# Patient Record
Sex: Female | Born: 1995 | Race: Black or African American | Hispanic: No | Marital: Single | Smoking: Never smoker
Health system: Southern US, Community
[De-identification: ages and names within clinical notes are randomized; demographics above are authoritative.]

## PROBLEM LIST (undated history)

## (undated) DIAGNOSIS — N6312 Unspecified lump in the right breast, upper inner quadrant: Secondary | ICD-10-CM

---
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
## 2018-11-07 IMAGING — CT CT HEAD W/O CM
3 series · 14 of 46 positions shown, 16 images · non-contrast
Comparison: None.

CLINICAL DATA: Persistent headache with dilatation of pupils

EXAM:
CT HEAD WITHOUT CONTRAST
TECHNIQUE: Contiguous axial images were obtained from the base of the skull
through the vertex without intravenous contrast.

[Series 2: head wo · axial · 0.47mm/px · z∈[+1578,+1698]mm · 8 of 29 slices shown, 10 images]
[im 3/29  brain]
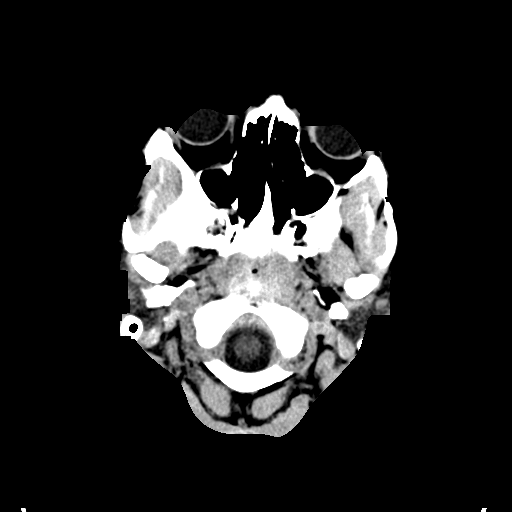
[im 3/29  bone]
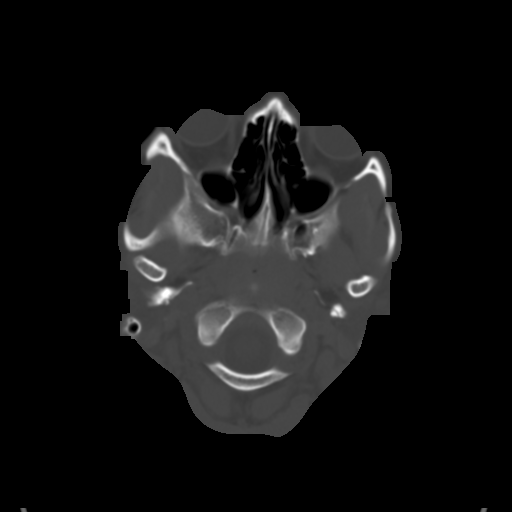
[im 7/29  brain]
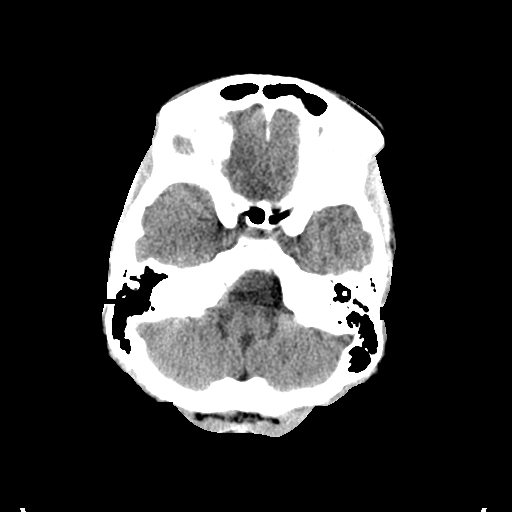
[im 10/29  brain]
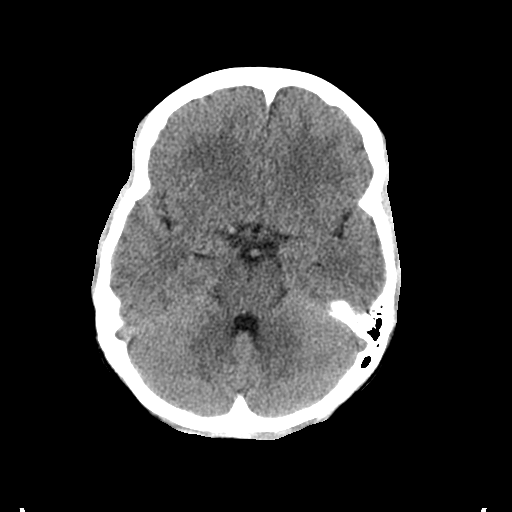
[im 13/29  brain]
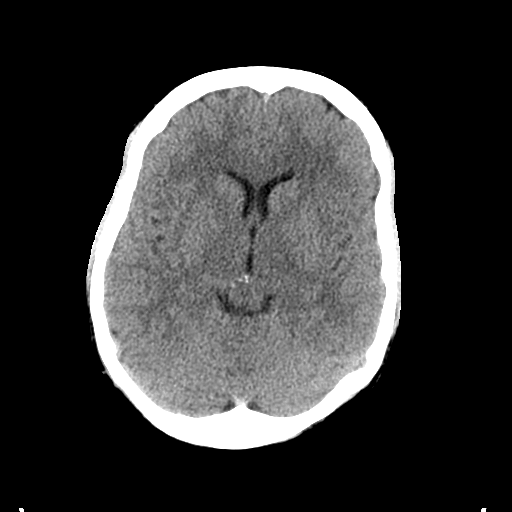
[im 17/29  brain]
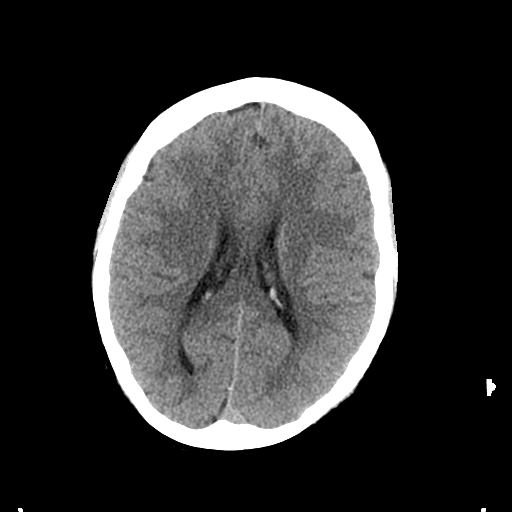
[im 17/29  bone]
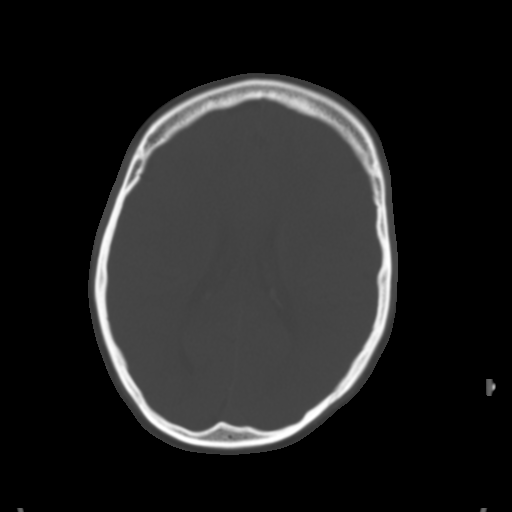
[im 20/29  brain]
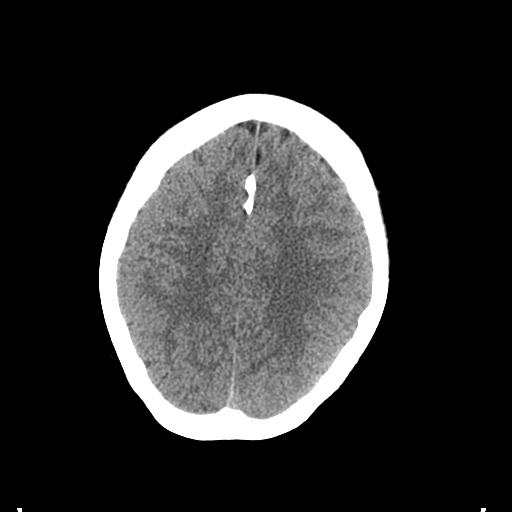
[im 23/29  brain]
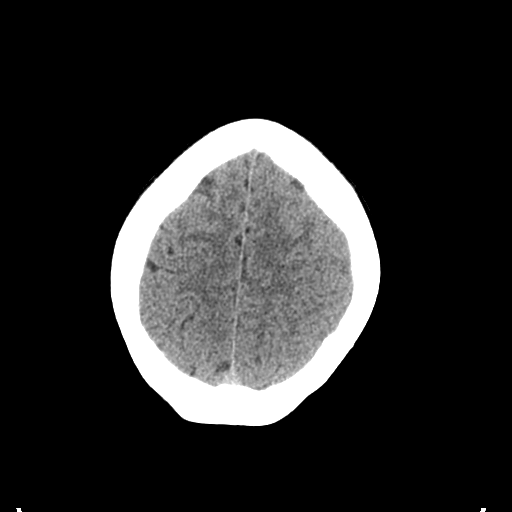
[im 27/29  brain]
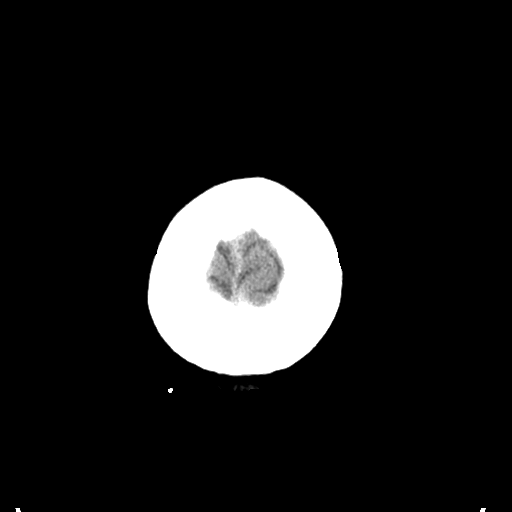

[Series 4: coronal soft tissue · coronal · 0.28mm/px · 3 of 81 slices shown]
[im 27/81  brain]
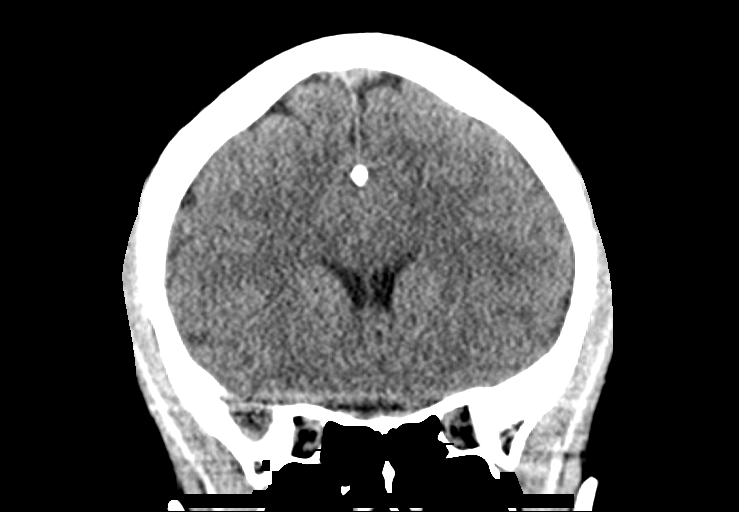
[im 36/81  brain]
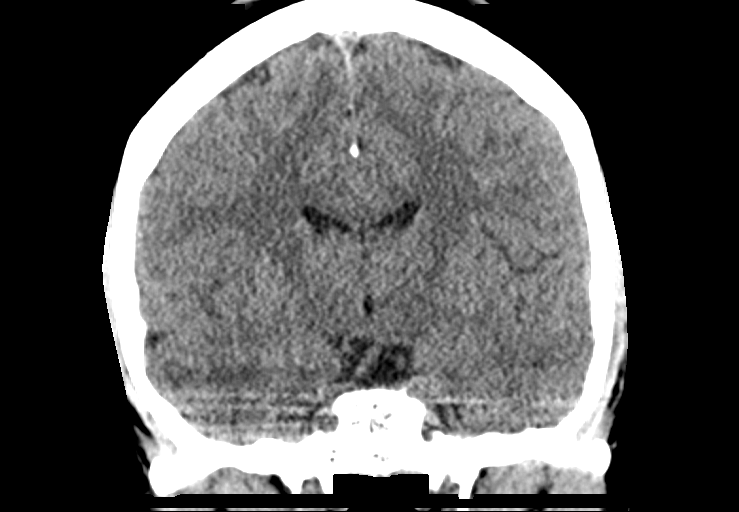
[im 45/81  brain]
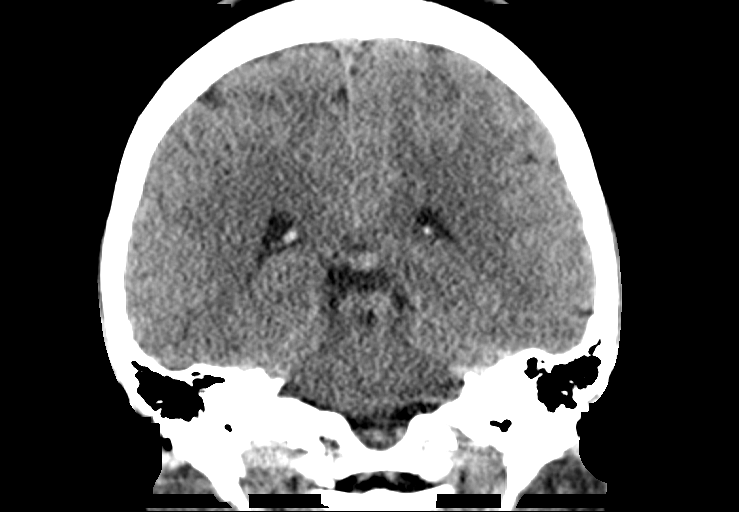

[Series 5: sagittal soft tissue · sagittal · 0.33mm/px · 3 of 61 slices shown]
[im 21/61  brain]
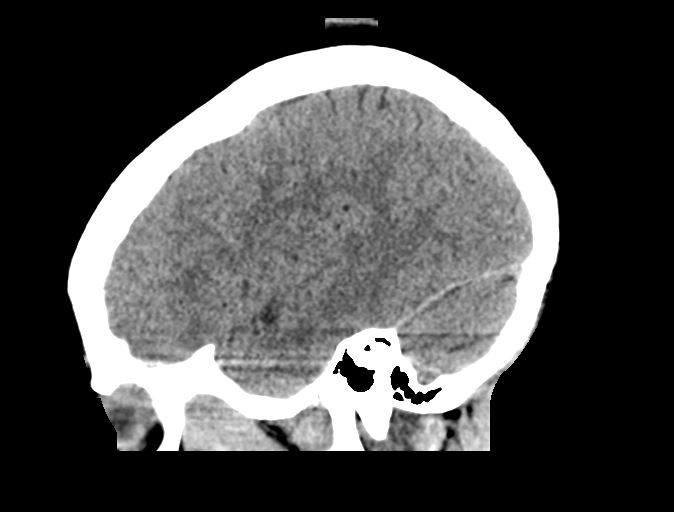
[im 31/61  brain]
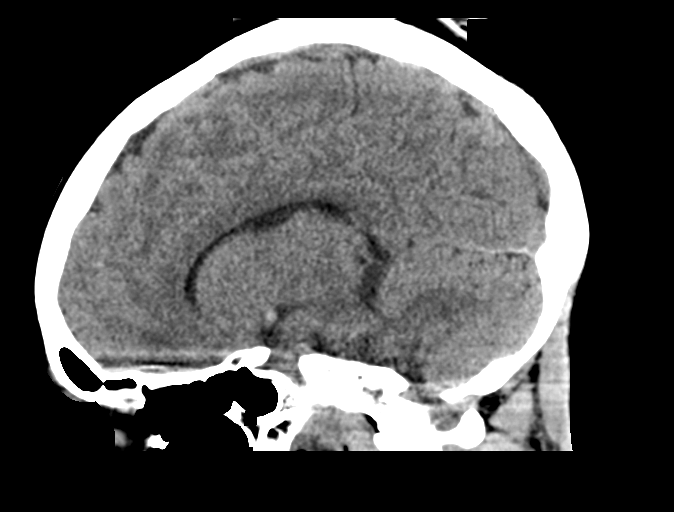
[im 41/61  brain]
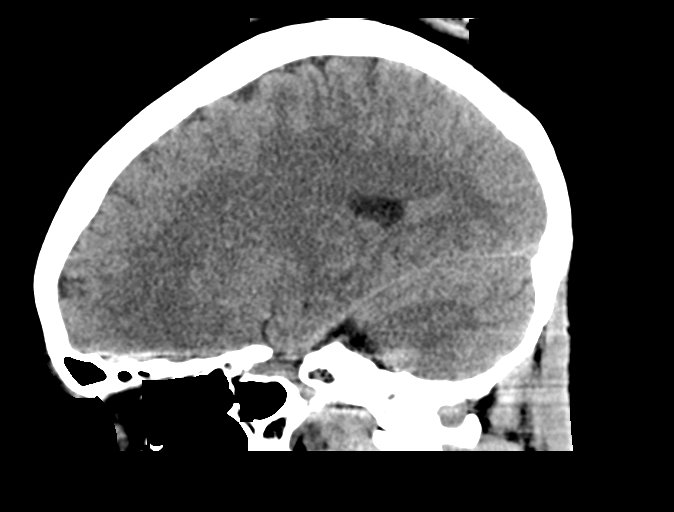

[14 of 46 positions shown; findings below may reference images not displayed]

FINDINGS: Brain: The ventricles are normal in size and configuration. There is
no intracranial mass, hemorrhage, extra-axial fluid collection, or
midline shift. There is mild calcification in the pineal gland, felt
to be benign. There are benign-appearing calcifications along the
falx. No edema evident. Gray-white compartments appear normal. No
evident acute infarct.

Vascular: No hyperdense vessel.  No evident vascular calcification.

Skull: Bony calvarium appears intact.

Sinuses/Orbits: Visualized paranasal sinuses are clear. Visualized
orbits appear symmetric bilaterally.

Other: Mastoid air cells are clear.
IMPRESSION: Mild pineal gland calcification, felt to be benign. Study otherwise
unremarkable.
